# Patient Record
Sex: Female | Born: 1965 | Race: White | Hispanic: No | Smoking: Current every day smoker
Health system: Southern US, Community
[De-identification: ages and names within clinical notes are randomized; demographics above are authoritative.]

## PROBLEM LIST (undated history)

## (undated) DIAGNOSIS — F32A Depression, unspecified: Secondary | ICD-10-CM

## (undated) DIAGNOSIS — F329 Major depressive disorder, single episode, unspecified: Secondary | ICD-10-CM

## (undated) HISTORY — PX: CHOLECYSTECTOMY: SHX55

## (undated) HISTORY — DX: Depression, unspecified: F32.A

## (undated) HISTORY — PX: CORONARY ARTERY BYPASS GRAFT: SHX141

## (undated) HISTORY — DX: Major depressive disorder, single episode, unspecified: F32.9

---

## 1999-04-01 ENCOUNTER — Other Ambulatory Visit: Admission: RE | Admit: 1999-04-01 | Discharge: 1999-04-01 | Payer: Self-pay | Admitting: *Deleted

## 2000-05-11 ENCOUNTER — Other Ambulatory Visit: Admission: RE | Admit: 2000-05-11 | Discharge: 2000-05-11 | Payer: Self-pay | Admitting: *Deleted

## 2005-10-30 ENCOUNTER — Other Ambulatory Visit: Admission: RE | Admit: 2005-10-30 | Discharge: 2005-10-30 | Payer: Self-pay | Admitting: Obstetrics and Gynecology

## 2006-02-16 ENCOUNTER — Inpatient Hospital Stay (HOSPITAL_COMMUNITY): Admission: AD | Admit: 2006-02-16 | Discharge: 2006-02-16 | Payer: Self-pay | Admitting: Obstetrics & Gynecology

## 2006-04-20 ENCOUNTER — Inpatient Hospital Stay (HOSPITAL_COMMUNITY): Admission: AD | Admit: 2006-04-20 | Discharge: 2006-04-20 | Payer: Self-pay | Admitting: Obstetrics and Gynecology

## 2006-05-15 ENCOUNTER — Inpatient Hospital Stay (HOSPITAL_COMMUNITY): Admission: RE | Admit: 2006-05-15 | Discharge: 2006-05-19 | Payer: Self-pay | Admitting: Obstetrics and Gynecology

## 2006-07-08 ENCOUNTER — Ambulatory Visit (HOSPITAL_COMMUNITY): Admission: RE | Admit: 2006-07-08 | Discharge: 2006-07-08 | Payer: Self-pay | Admitting: Obstetrics and Gynecology

## 2006-10-28 ENCOUNTER — Ambulatory Visit (HOSPITAL_COMMUNITY): Admission: RE | Admit: 2006-10-28 | Discharge: 2006-10-28 | Payer: Self-pay | Admitting: Obstetrics and Gynecology

## 2008-03-02 IMAGING — RF DG HYSTEROGRAM
5 series · 9 of 9 positions shown · non-contrast
Comparison: none

CLINICAL DATA: 40 year-old status-post Essure.  HSG is performed by Dr. Labelle Salha.  Five images are submitted.
HYSTEROSALPINGOGRAM:

[Series 1: run · 2 of 2 slices shown (1 of 5)]
[im 1/2]
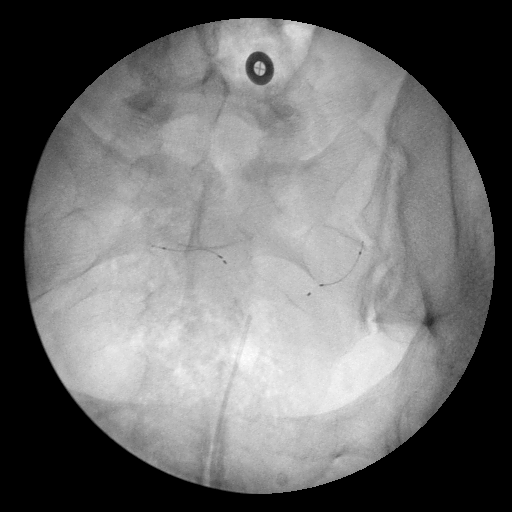
[im 2/2]
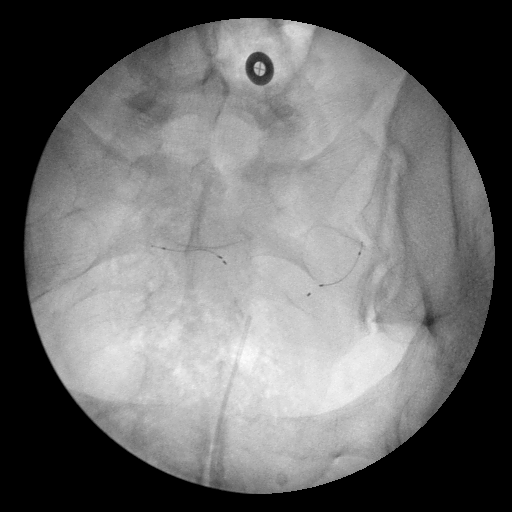

[Series 2: run · 1 of 1 slices shown (2 of 5)]
[im 1/1]
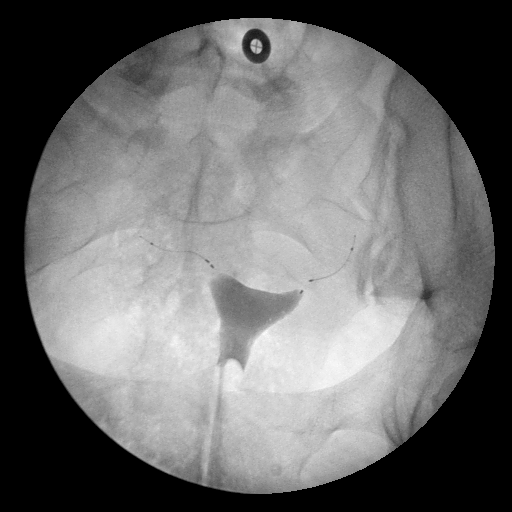

[Series 3: run · 2 of 2 slices shown (3 of 5)]
[im 1/2]
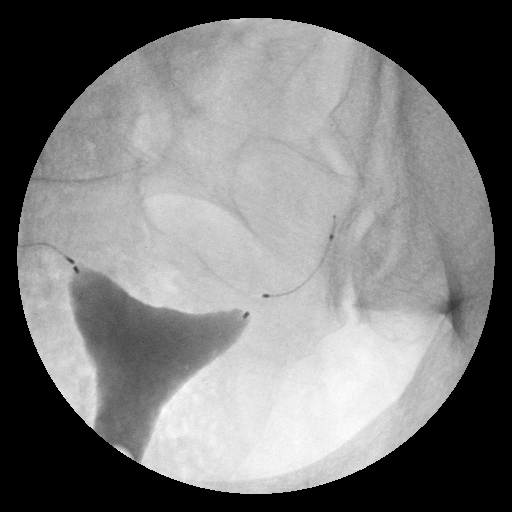
[im 2/2]
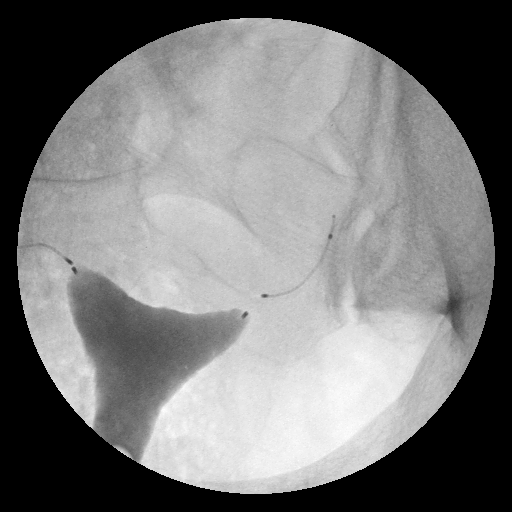

[Series 4: run · 2 of 2 slices shown (4 of 5)]
[im 1/2]
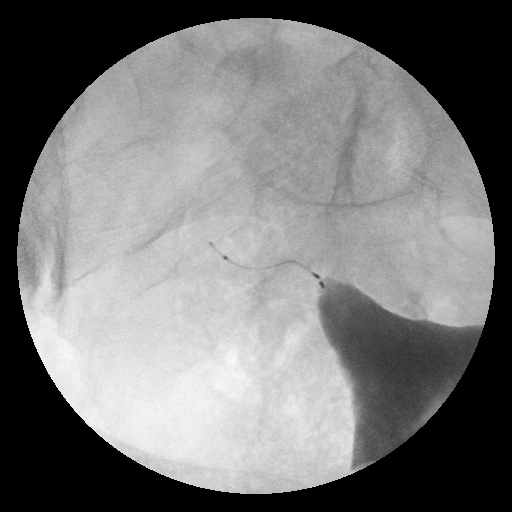
[im 2/2]
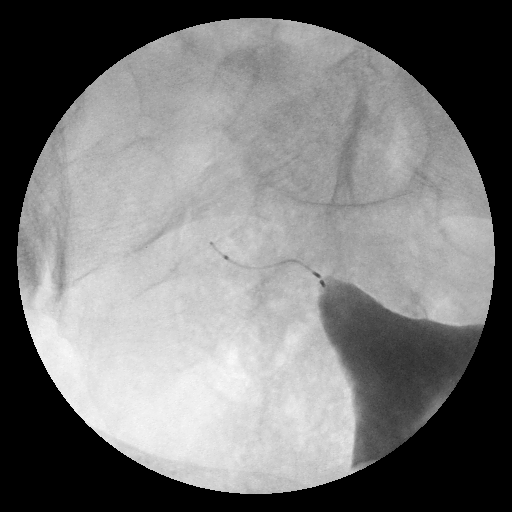

[Series 5: run · 2 of 2 slices shown (5 of 5)]
[im 1/2]
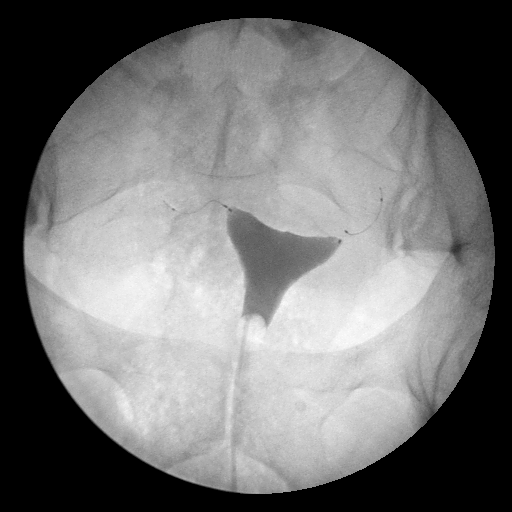
[im 2/2]
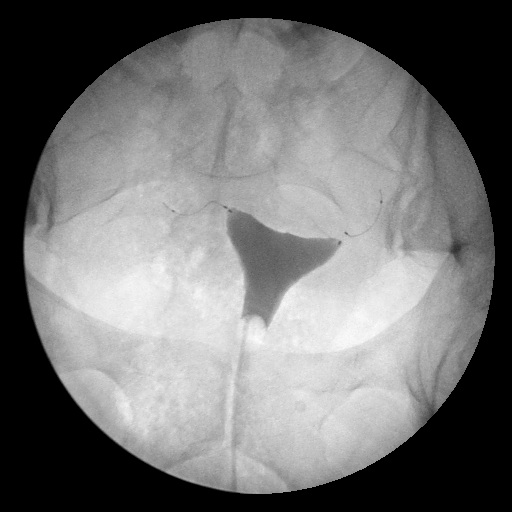

[9 of 9 positions shown; findings below may reference images not displayed]

FINDINGS: Bilateral Essure microinserts are in place.  The proximal ends are positioned at the cornual regions bilaterally.  No contrast is seen to reflux along the microinserts or distally.  The contour of the uterus is normal.
IMPRESSION: No evidence for tubal patency.

## 2009-01-28 ENCOUNTER — Emergency Department (HOSPITAL_COMMUNITY): Admission: EM | Admit: 2009-01-28 | Discharge: 2009-01-28 | Payer: Self-pay | Admitting: Emergency Medicine

## 2010-06-03 IMAGING — CT CT HEAD W/O CM
1 of 2 series · 13 of 30 positions shown, 17 images · non-contrast
Comparison: None available.

CLINICAL DATA: Seizures, fever.

CT HEAD WITHOUT CONTRAST
TECHNIQUE: Contiguous axial images were obtained from the base of
the skull through the vertex without contrast.

[Series 2: brain · axial · 0.47mm/px · z∈[+151,+276]mm · 13 of 28 slices shown, 17 images]
[im 2/28  brain]
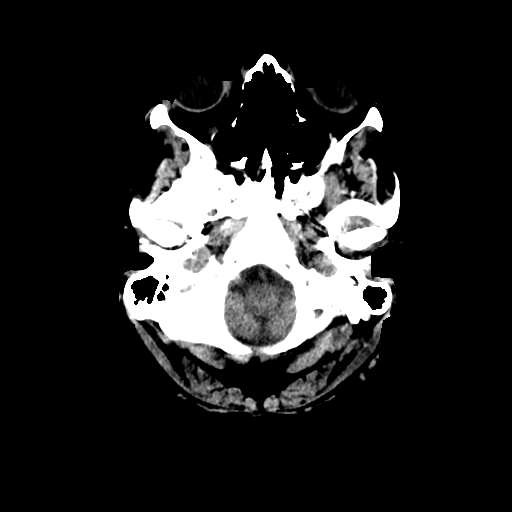
[im 2/28  bone]
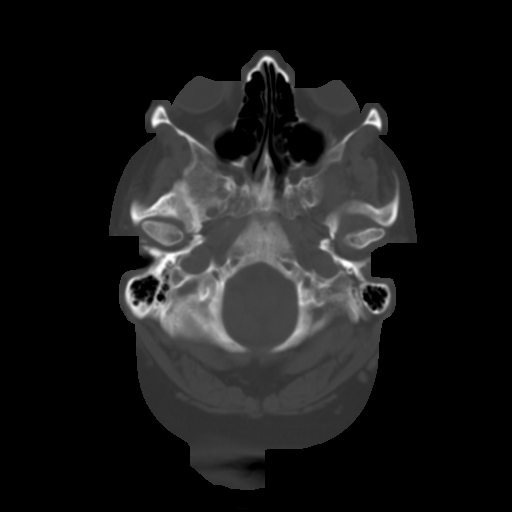
[im 4/28  brain]
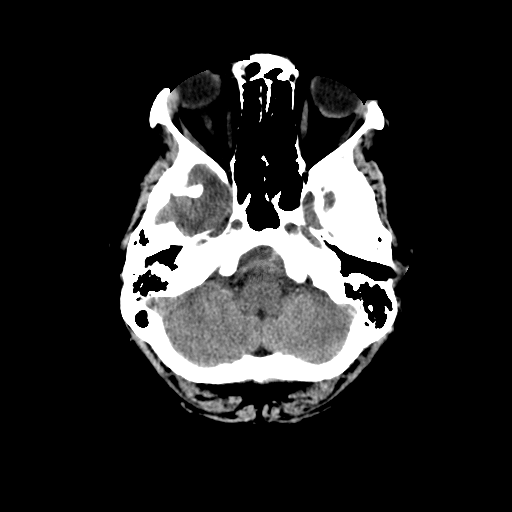
[im 6/28  brain]
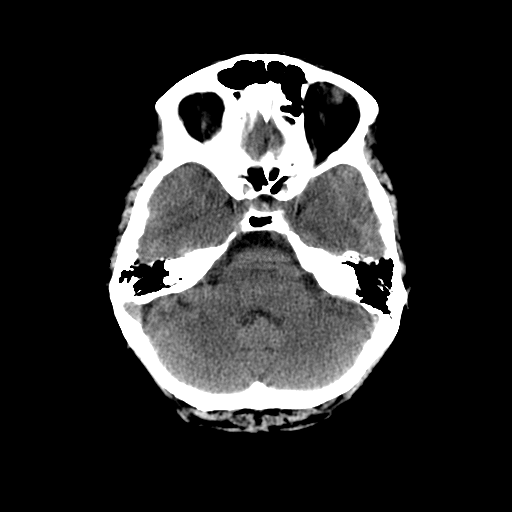
[im 8/28  brain]
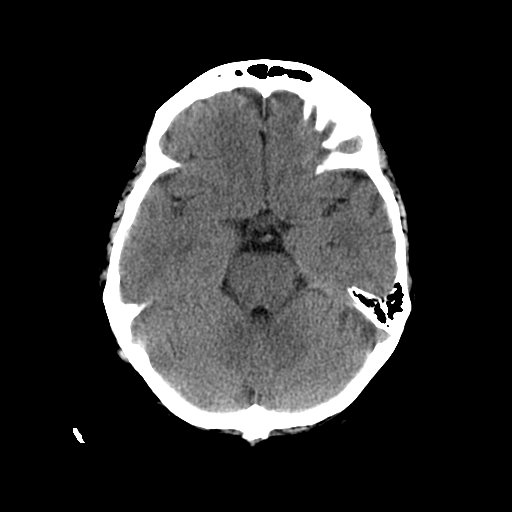
[im 10/28  brain]
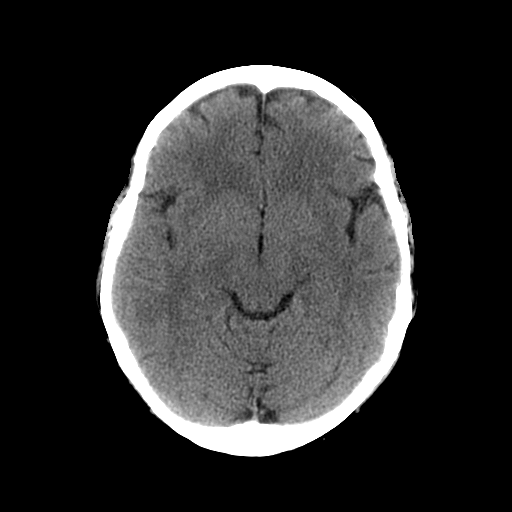
[im 10/28  bone]
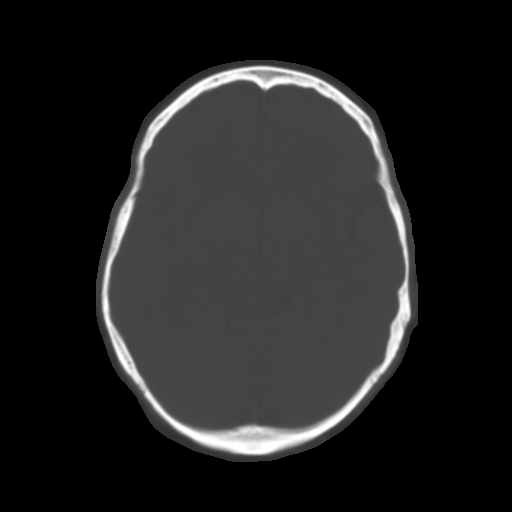
[im 12/28  brain]
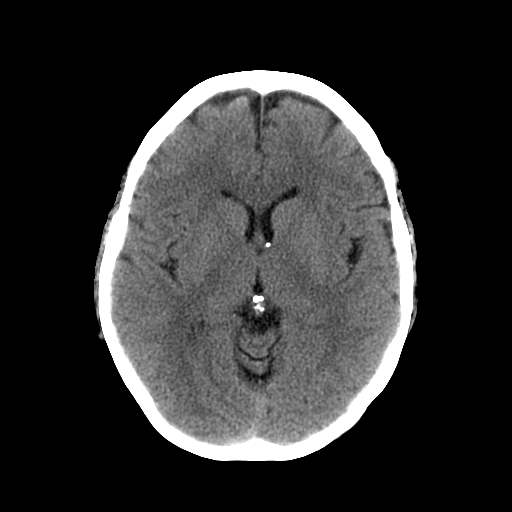
[im 14/28  brain]
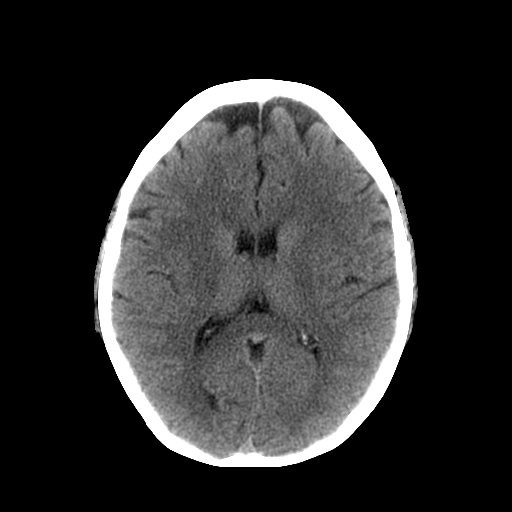
[im 16/28  brain]
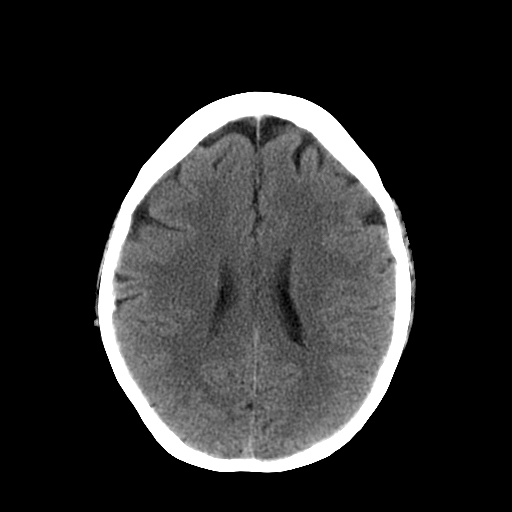
[im 18/28  brain]
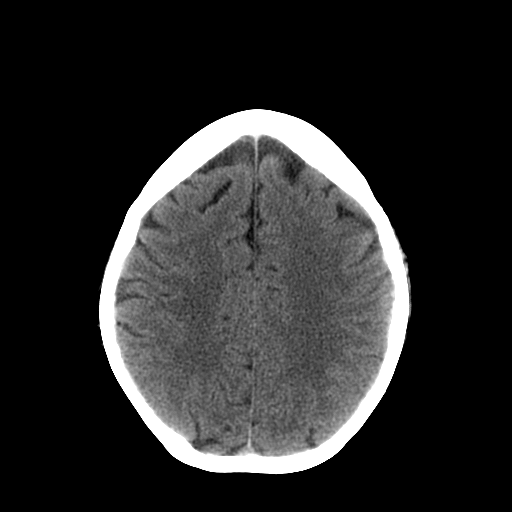
[im 18/28  bone]
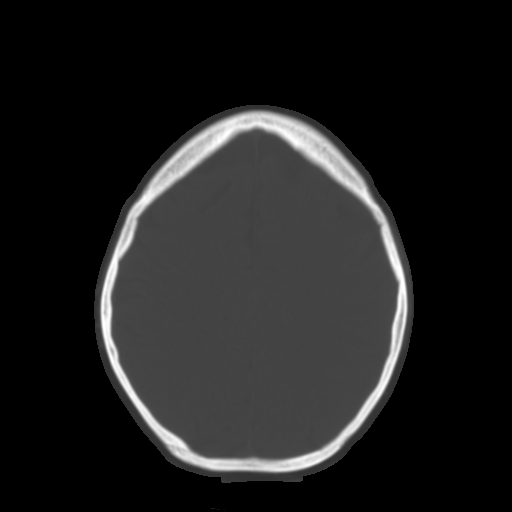
[im 20/28  brain]
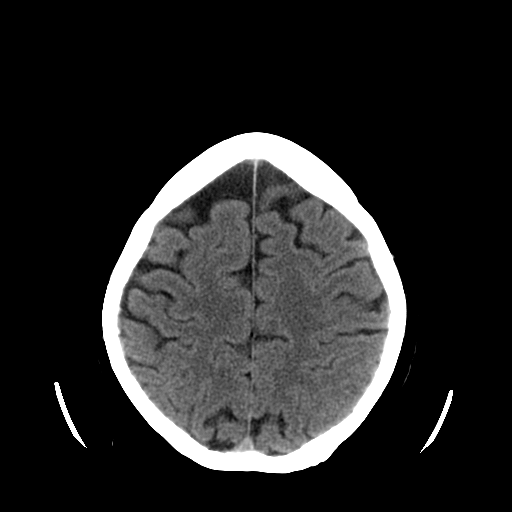
[im 22/28  brain]
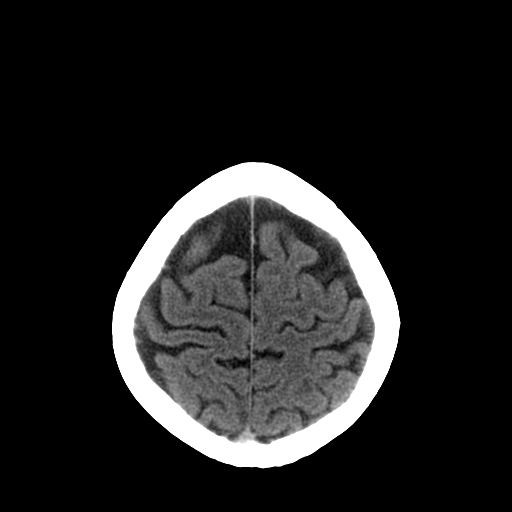
[im 24/28  brain]
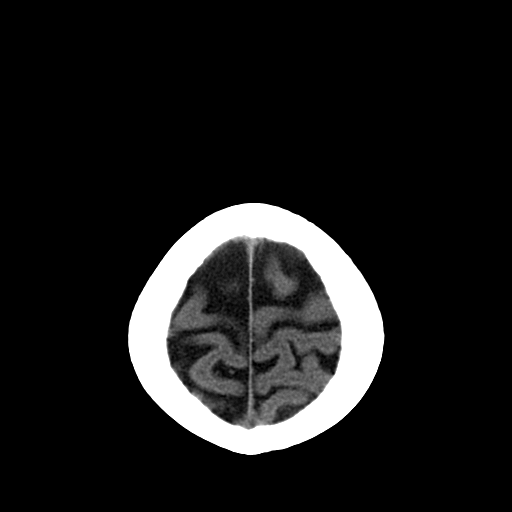
[im 26/28  brain]
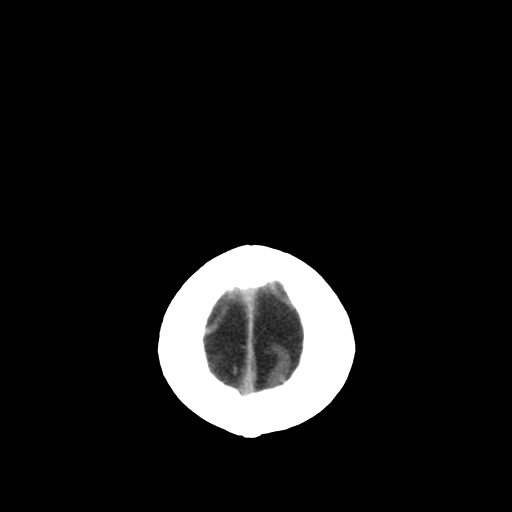
[im 26/28  bone]
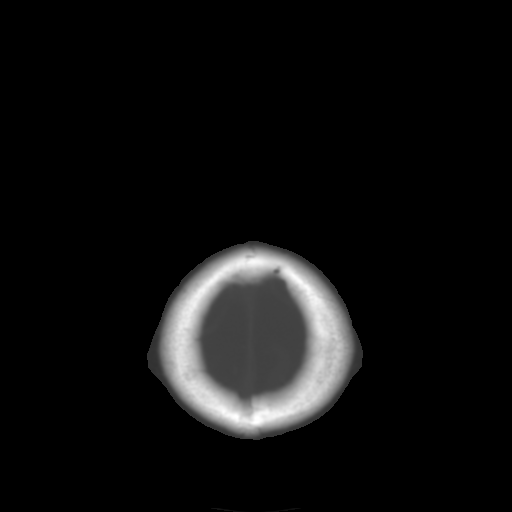

[13 of 30 positions shown; findings below may reference images not displayed]

FINDINGS: There is some cortical atrophy.  No evidence of acute
intracranial abnormality including acute infarction, hemorrhage,
mass lesion, mass effect, midline shift or abnormal extra-axial
fluid collection.  No hydrocephalus.  Imaged paranasal sinuses and
mastoid air cells clear.
IMPRESSION: No acute finding.

## 2011-01-15 LAB — COMPREHENSIVE METABOLIC PANEL
AST: 25 U/L (ref 0–37)
BUN: 5 mg/dL — ABNORMAL LOW (ref 6–23)
CO2: 24 mEq/L (ref 19–32)
GFR calc Af Amer: >60 mL/min (ref >60)
GFR calc non Af Amer: >60 mL/min (ref >60)
Glucose, Bld: 93 mg/dL (ref 70–99)
Potassium: 3.2 mEq/L — ABNORMAL LOW (ref 3.5–5.1)

## 2011-01-15 LAB — URINALYSIS, ROUTINE W REFLEX MICROSCOPIC
Bilirubin Urine: NEGATIVE
Glucose, UA: NEGATIVE mg/dL
Ketones, ur: NEGATIVE mg/dL
Ketones, ur: NEGATIVE mg/dL
Protein, ur: NEGATIVE mg/dL

## 2011-01-15 LAB — URINE MICROSCOPIC-ADD ON

## 2011-01-15 LAB — CBC
HCT: 43.1 % (ref 36.0–46.0)
Hemoglobin: 14.8 g/dL (ref 12.0–15.0)
Platelets: 274 10*3/uL (ref 150–400)
RBC: 4.84 MIL/uL (ref 3.87–5.11)
RDW: 13.2 % (ref 11.5–15.5)

## 2011-01-15 LAB — URINE CULTURE: Colony Count: >=100000

## 2011-01-15 LAB — WET PREP, GENITAL
Clue Cells Wet Prep HPF POC: NONE SEEN
Yeast Wet Prep HPF POC: NONE SEEN

## 2011-01-15 LAB — GC/CHLAMYDIA PROBE AMP, GENITAL: GC Probe Amp, Genital: NEGATIVE

## 2011-02-21 NOTE — Op Note (Signed)
Ann Wood, Ann Wood           ACCOUNT NO.:  [id number]   MEDICAL RECORD NO.:  [id number]          PATIENT TYPE:  INP   LOCATION:  9133                          FACILITY:  [facility]   PHYSICIAN:  Juluis Mire, M.D.   DATE OF BIRTH:  [birth year]   DATE OF PROCEDURE:  05/15/2006  DATE OF DISCHARGE:                                 OPERATIVE REPORT   PREOPERATIVE DIAGNOSIS:  Intrauterine pregnancy at term with microsomia.   POSTOPERATIVE DIAGNOSIS:  Intrauterine pregnancy at term with microsomia.   PROCEDURE:  Low transverse caesarean section.   SURGEON:  Juluis Mire, M.D.   ANESTHESIA:  Spinal.   ESTIMATED BLOOD LOSS:  500 to 600 cc.   PACKING AND DRAINS:  None.   BLOOD REPLACED:  None.   COMPLICATIONS:  None.   INDICATIONS:  Details in history and physical.   DESCRIPTION OF PROCEDURE:  The patient was taken to the OR and placed in the  supine position with left lateral tilt.  After satisfactory level of spinal  anesthesia was obtained, the abdomen was prepped with Betadine and draped  sterile field.  A low transverse skin incision was made with the knife and  carried through the subcutaneous tissue.  Fascia was entered sharply  incising the fascia laterally.  Fascia was taken off the muscle superiorly  and inferiorly.  Rectus muscles were separated in the midline.  Peritoneum  was entered sharply.  Incision in peritoneum extended both superiorly and  inferiorly.  A low transverse bladder flap was developed.  Low transverse  uterine incision was begun and extended laterally using manual traction. The  infant was delivered with elevation of head and fundal pressure.  It was a  viable female weighing 9 pounds 5 ounces, Apgars were 9/9.  Umbilical cord  pH 7.34.  Placenta was delivered manually.  Uterus was exteriorized for  closure.  Tubes and ovaries were unremarkable.  Uterus was closed with  running locking suture of 0 Vicryl using a two layer closure technique.  We  had  good hemostasis and clear urine output.  Uterus was returned to the  abdominal cavity.  Muscles and peritoneum were closed with running suture of  3-0 Vicryl, fascia closed with running suture of 0 PDS, skin was closed with  staples  and Steri-Strips.  Sponge and instrument counts reported correct by the  circulating nurse x2.  Foley catheter remained clear at time of  closure.  Patient tolerated the procedure well and returned to the recovery room in  good condition.      Juluis Mire, M.D.  Electronically Signed     JSM/MEDQ  D:  05/15/2006  T:  05/15/2006  Job:  [id number]

## 2011-02-21 NOTE — Op Note (Signed)
Ann Wood, Ann Wood           ACCOUNT NO.:  [id number]   MEDICAL RECORD NO.:  [id number]          PATIENT TYPE:  AMB   LOCATION:  SDC                           [facility]ACILITY:  [facility]   PHYSICIAN:  Juluis Mire, M.D.   DATE OF BIRTH:  07/24/1966   DATE OF PROCEDURE:  07/08/2006  DATE OF DISCHARGE:                                 OPERATIVE REPORT   PREOPERATIVE DIAGNOSIS:  Multiparity, desires sterility.   POSTOPERATIVE DIAGNOSIS:  Multiparity, desires sterility.   PROCEDURE:  Hysteroscopy with placement of the Essure bilaterally.   SURGEON:  Juluis Mire, M.D.   ANESTHESIA:  Paracervical block and MAC.   COMPLICATIONS:  None.   INTRAOPERATIVE BLOOD PLACEMENT:  None.   ESTIMATED BLOOD LOSS:  Minimal.   INDICATIONS FOR PROCEDURE:  Please see dictated history and physical.   DESCRIPTION OF PROCEDURE:  Patient taken to the OR and placed in the supine  position.  After sedation, was placed in the dorsal lithotomy position using  the Allen stirrups.  Patient then draped in a sterile field.  A speculum was  placed in the vaginal vault.  Cervix and vagina were cleansed with Betadine.  Paracervical block was instituted using 1% Nesacaine.  Cervix was secured  with single-tooth tenaculum.  Cervix already dilated and required no  dilatation.  The hysteroscope was introduced.  Intrauterine cavity was  distended using Sorbitol.  Both tubal ostia were visualized.  The Essure was  advanced into each tubal ostium up to the black mark.  The coil was then  brought backward until stop.  We then identified the notch in the coil.  We  then pressed the button and again rotated the wheel backward until full  stop.  We good uncoiling on both sides of the Essure.  After 10 seconds, we  rotated the handle counterclockwise until the delivery device was removed.  The procedure was followed bilaterally.  There were no signs of  complications.  We had approximately three coils visible at each tube.  There was no bleeding.  Total deficit was 300 mL.  At this point in time,  single-tooth tenaculum was inspected and removed.  Patient taken out of the  dorsal lithotomy position and once alert, transferred to the recovery room  in good condition.  Sponge, needle and instrument counts were reported as  correct by the circulating nurse.      Juluis Mire, M.D.  Electronically Signed    JSM/MEDQ  D:  07/08/2006  T:  07/09/2006  Job:  [id number]

## 2011-02-21 NOTE — H&P (Signed)
Ann Wood, Ann Wood           ACCOUNT NO.:  [id number]   MEDICAL RECORD NO.:  [id number]          PATIENT TYPE:  INP   LOCATION:  NA                            [facility]  [facility]   PHYSICIAN:  Juluis Mire, M.D.   DATE OF BIRTH:  Jan 21, 1966   DATE OF ADMISSION:  05/15/2006  DATE OF DISCHARGE:                                HISTORY & PHYSICAL   HISTORY OF PRESENT ILLNESS:  The patient is a 45 year old gravida 3, para 1,  abortus 1 female, last menstrual period August 31, 2005, giving an  estimated date of confinement of May 24, 2006.  This gives her an  estimated gestational age of [redacted] weeks, consistent with initial exam as well  as ultrasound.  Her prenatal course has been complicated by advanced  maternal age.  She underwent amniocentesis with findings of chromosomally  normal infant.  Subsequently, we have been watching her.  She has had  increasing fundal heights, larger than dates.  Subsequent ultrasound done on  May 08, 2006 revealed an estimated fetal weight of over 4000 g, at 9  pounds and 5 ounces.  We had discussed with her the potential risk of  macrosomia with shoulder dystocia.  After discussion of induction versus  primary cesarean section, the patient has decided to proceed with primary  cesarean section.  It is of note that on pelvic exam, the cervix was  extremely long and closed.  Vertex is out of the pelvis.   ALLERGIES:  NO KNOWN DRUG ALLERGIES.   MEDICATIONS:  Prenatal vitamins.   PAST MEDICAL HISTORY:  Please see prenatal records.   FAMILY HISTORY:  Please see prenatal records.   SOCIAL HISTORY:  Please see prenatal records.   REVIEW OF SYSTEMS:  Noncontributory.   PHYSICAL EXAMINATION:  VITAL SIGNS:  The patient is afebrile with stable  vital signs.  HEENT:  The patient is normocephalic.  Pupils equal, round, reactive to  light and accommodation.  Extraocular movements are intact.  Sclerae and  conjunctivae clear.  Oropharynx clear.  NECK:   Thyroid nonpalpable.  BREASTS:  Glandular, no discrete masses.  LUNGS:  Clear.  CARDIAC:  Regular rate with no murmurs or gallops.  ABDOMEN:  Gravid uterus consistent with dates.  PELVIC:  The cervix is extremely long and closed, with the presenting part  out of the pelvis.  EXTREMITIES:  Trace edema.  Deep tendon reflexes 2+, no clonus.  NEUROLOGIC:  Grossly within normal limits.   IMPRESSION:  1. Intrauterine pregnancy at 39 weeks with gross macrosomia.  2. Advanced maternal age with normal chromosome studies.   PLAN:  I have discussed the options.  Will proceed with primary cesarean  section.  The risks were discussed, including the risks of infection,  the  risk of hemorrhage which could require a transfusion, the risks of AIDS or  hepatitis, the risk of injury to adjacent organs, including bladder, bowel,  or ureters which could require further exploratory surgery; the risk of deep  venous thrombosis and pulmonary embolus.  The patient expressed  understanding of the indications and risks.      Ann Wood  Lisbeth Ply, M.D.  Electronically Signed     JSM/MEDQ  D:  05/15/2006  T:  05/15/2006  Job:  [id number]

## 2011-02-21 NOTE — H&P (Signed)
NAME:  Ann Wood, Ann Wood           ACCOUNT NO.:  [id number]   MEDICAL RECORD NO.:  [id number]          PATIENT TYPE:  AMB   LOCATION:  SDC                           [facility]   PHYSICIAN:  Juluis Mire, M.D.   DATE OF BIRTH:  [birth year]   DATE OF ADMISSION:  07/08/2006  DATE OF DISCHARGE:                                HISTORY & PHYSICAL   The patient is a 45 year old gravida 3, para 2, abortus 1, married female  who presents for permanent sterilization using the Essure process.  In  relation to the present admission, the patient recently had a delivery.  She  desires permanent sterilization.  The potential irreversibility of  sterilization was explained.  Failure rates are discussed.  Alternatives for  birth control have been explained.   ALLERGIES:  No known drug allergies.   MEDICATIONS:  Vitamins.   PAST MEDICAL HISTORY:  Usual childhood diseases.  No significant sequelae.   PAST SURGICAL HISTORY:  She has had a cholecystectomy at age 77.   OBSTETRICAL HISTORY:  She had a vaginal delivery in 1997, had a miscarriage  in 2004, and this year had a delivery by cesarean section.   FAMILY HISTORY:  Noncontributory.   SOCIAL HISTORY:  No tobacco or alcohol use.   REVIEW OF SYSTEMS:  Noncontributory.   PHYSICAL EXAMINATION:  GENERAL:  The patient is afebrile with stable vital signs.  HEENT:  The patient is normocephalic, pupils equal, round, and reactive to  light and accommodation, extraocular movements were intact.  Sclerae and  conjunctivae are clear.  Oropharynx clear.  NECK:  Without thyromegaly.  BREASTS:  No discrete masses.  LUNGS:  Clear.  CARDIOVASCULAR SYSTEM:  Regular rhythm and rate without murmurs or gallops.  ABDOMEN:  Her abdominal exam is benign, there are no masses, organomegaly or  tenderness noted.  PELVIC:  On pelvic exam, normal external genitalia.  Vaginal mucosa is  clear.  Cervix is unremarkable.  Uterus feels to be of normal size, shape  and contour.  Adnexa free of masses or tenderness.  Rectovaginal exam is  clear.  EXTREMITIES:  Trace edema.  NEUROLOGIC:  Exam is grossly within normal limits.   IMPRESSION:  Multiparity, desires sterility.   PLAN:  The patient will undergo hysteroscopic evaluation with placement of  the Ensure device.  The risks of the procedure have been explained including  the potential risk of infection.  The risk of hemorrhage through a  vasculature injury that could require transfusion with associated risks of  AIDS or hepatitis, hemorrhage could also require hysterectomy.  There is a  risk of perforating the uterus that could lead to injury to adjacent organs  included but not limited to bowel that could require exploratory surgery.  There is a risk of deep venous thrombosis and pulmonary embolus.  There is  also the potential, at the time of the hysterosalpingogram, tubes may not be  blocked or there may be displacement of the coil into the abdomen requiring  laparoscopic removal or outside of the uterus that could require  alternatives for birth control.  The patient expressed her understanding  of  indications and risks.      Juluis Mire, M.D.  Electronically Signed     JSM/MEDQ  D:  07/08/2006  T:  07/08/2006  Job:  [id number]

## 2011-02-21 NOTE — Discharge Summary (Signed)
Ann Wood, HEIDLER           ACCOUNT NO.:  [id number]   MEDICAL RECORD NO.:  [id number]          PATIENT TYPE:  INP   LOCATION:  9133                          [facility]ACILITY:  [facility]   PHYSICIAN:  Guy Sandifer. Henderson Cloud, M.D. DATE OF BIRTH:  1965-11-19   DATE OF ADMISSION:  05/15/2006  DATE OF DISCHARGE:  05/19/2006                                 DISCHARGE SUMMARY   ADMITTING DIAGNOSES:  1. Intrauterine pregnancy at term.  2. Fetal macrosomia.   DISCHARGE DIAGNOSES:  1. Status post low transverse cesarean section.  2. Viable female infant.   PROCEDURE:  Primary low transverse cesarean section.   REASON FOR ADMISSION:  Please see dictated H&P.   HOSPITAL COURSE:  Patient is a 45 year old gravida 3, para 1 that was  admitted to [facility] for a scheduled cesarean section.  The patient had fetus with estimated fetal weight of greater than 4000  grams.  She was admitted for cesarean delivery.  On the morning of  admission, the patient was taken to the operating room where spinal  anesthesia was administered without difficulty.  A low transverse incision  was made with delivery of a viable female infant weighing 9 pounds 5 ounces  with Apgars of 9 at one minute and 9 at five minutes.  Arterial cord pH was  7.34.  The patient tolerated procedure well and taken to the recovery room  in stable condition.  On postoperative day #1 patient was without complaint.  Vital signs were stable.  She was afebrile.  Abdomen soft with good return  of bowel function.  Fundus firm and nontender.  Abdominal dressing had been  removed revealing an incision that was clean, dry and intact.  Laboratory  findings revealed hemoglobin of 10.8, platelet count of 264,000, WBC count  of 12.8.  On postoperative day #2 patient did complain of some abdominal  distention.  She was without nausea.  Vital signs were stable.  Abdomen was  soft with good return of bowel function.  Fundus firm and nontender.  Incision was clean, dry and intact.  The patient was administered Dulcolax  suppository.  On the following morning, patient did complain of some trapped  gas; she did admit to some flatus and positive results with Dulcolax  suppository.  The vital signs remained stable.  She was afebrile.  Incision  was clean, dry and intact.  On the following morning, the patient was  somewhat tearful.  Baby had sustained some weight loss and was unable to be  discharged.  Patient's vital signs were stable.  She was afebrile.  Abdomen  was now soft with good return of bowel function.  Fundus firm and nontender.  Incision was clean, dry and intact.  Staples were left intact.  She was  ambulating well.  Discharge instructions were reviewed and the patient was  later discharged home   CONDITION ON DISCHARGE:  Stable.   DIET:  Regular as tolerated.   ACTIVITY:  No heavy lifting, no driving x2 weeks, no vaginal entry.   FOLLOWUP:  Patient to follow up in the office in 1-2 weeks for an incision  check.  She is to call for temperature greater than 100 degrees, persistent  nausea, vomiting, heavy vaginal bleeding and/or redness or drainage from the  incisional site.   DISCHARGE MEDICATIONS:  1. Percocet 5/325 #30 1 p.o. q.4-6h. p.r.n.  2. Motrin 600 mg q.6h.  3. Prenatal vitamins 1 p.o. daily.  4. Tandem. 1 p.o. daily.      Julio Sicks, N.P.      Guy Sandifer. Henderson Cloud, M.D.  Electronically Signed    CC/MEDQ  D:  06/12/2006  T:  06/12/2006  Job:  [id number]

## 2012-11-17 ENCOUNTER — Ambulatory Visit (INDEPENDENT_AMBULATORY_CARE_PROVIDER_SITE_OTHER): Payer: 59 | Admitting: Physician Assistant

## 2012-11-17 VITALS — BP 115/77 | HR 66 | Temp 98.0°F | Resp 14 | Ht 66.5 in | Wt 255.0 lb

## 2012-11-17 DIAGNOSIS — J029 Acute pharyngitis, unspecified: Secondary | ICD-10-CM

## 2012-11-17 DIAGNOSIS — J019 Acute sinusitis, unspecified: Secondary | ICD-10-CM

## 2012-11-17 DIAGNOSIS — R05 Cough: Secondary | ICD-10-CM

## 2012-11-17 LAB — POCT INFLUENZA A/B: Influenza A, POC: NEGATIVE

## 2012-11-17 MED ORDER — HYDROCODONE-HOMATROPINE 5-1.5 MG/5ML PO SYRP: ORAL_SOLUTION | ORAL | Status: DC

## 2012-11-17 MED ORDER — IPRATROPIUM BROMIDE 0.06 % NA SOLN: 2.0000 | Freq: Three times a day (TID) | NASAL | Status: DC

## 2012-11-17 MED ORDER — AMOXICILLIN-POT CLAVULANATE 875-125 MG PO TABS: 1.0000 | ORAL_TABLET | Freq: Two times a day (BID) | ORAL | Status: DC

## 2012-11-17 NOTE — Progress Notes (Signed)
Patient ID: Ann Wood MRN: [id number], DOB: 24-Apr-1966, 47 y.o. Date of Encounter: 11/17/2012, 12:39 PM  Primary Physician: No primary provider on file.  Chief Complaint:  Chief Complaint  Patient presents with  . Sinusitis    yesterday  . Sore Throat  . Headache  . Generalized Body Aches    HPI: 47 y.o. year old female presents with a two day history of nasal congestion, post nasal drip, sore throat, sinus pressure, and cough. Afebrile. No chills. Nasal congestion thick and green/yellow. Sinus pressure is the worst symptom along the frontal sinus. Cough is productive secondary to post nasal drip and not associated with time of day. No shortness of breath or wheezing. Sore throat has started to improve. Ears feel full, leading to sensation of muffled hearing. Has tried ibuprofen without success. No GI complaints. Appetite normal. Two kids are ill with similar symptoms. No recent antibiotics or recent travels. Currently smokes about half to less than half a pack per day. No leg trauma, sedentary periods, or h/o cancer. She is not interested taking medication to help with smoking cessation.   Past Medical History  Diagnosis Date  . Depression      Home Meds: Prior to Admission medications   Medication Sig Start Date End Date Taking? Authorizing Provider  FLUoxetine (PROZAC) 10 MG capsule Take 10 mg by mouth daily.   Yes Historical Provider, MD    Allergies: No Known Allergies  History   Social History  . Marital Status: Single    Spouse Name: N/A    Number of Children: N/A  . Years of Education: N/A   Occupational History  . Not on file.   Social History Main Topics  . Smoking status: Current Every Day Smoker -- 0.50 packs/day    Types: Cigarettes  . Smokeless tobacco: Not on file  . Alcohol Use: No  . Drug Use: No  . Sexually Active: Yes   Other Topics Concern  . Not on file   Social History Narrative  . No narrative on file     Review of  Systems: Constitutional: Positive for fatigue and myalgias. Negative for chills, fever, night sweats or weight changes HEENT: see above Cardiovascular: negative for chest pain or palpitations Respiratory: Positive for cough. Negative for hemoptysis, wheezing, dyspnea, or shortness of breath Abdominal: negative for abdominal pain, nausea, vomiting or diarrhea Dermatological: negative for rash Neurologic: Positive for headache. Negative for dizziness or vertigo   Physical Exam: Blood pressure 115/77, pulse 66, temperature 98 F (36.7 C), temperature source Oral, resp. rate 14, height 5' 6.5" (1.689 m), weight 255 lb (115.667 kg), last menstrual period 10/30/2012, SpO2 99.00%., Body mass index is 40.55 kg/(m^2). General: Well developed, well nourished, in no acute distress. Head: Normocephalic, atraumatic, eyes without discharge, sclera non-icteric, nares are congested. Bilateral auditory canals clear, TM's are without perforation, pearly grey with reflective cone of light bilaterally. Serous effusion bilaterally behind TM's. Frontal sinus TTP. Oral cavity moist, dentition normal. Posterior pharynx with post nasal drip and mild erythema. No peritonsillar abscess or tonsillar exudate. Uvula midline. Neck: Supple. No thyromegaly. Full ROM. No lymphadenopathy. Lungs: Clear bilaterally to auscultation without wheezes, rales, or rhonchi. Breathing is unlabored.  Heart: RRR with S1 S2. No murmurs, rubs, or gallops appreciated. Msk:  Strength and tone normal for age. Extremities: No clubbing or cyanosis. No edema. Neuro: Alert and oriented X 3. Moves all extremities spontaneously. CNII-XII grossly in tact. Psych:  Responds to questions appropriately with a normal  affect.   Labs: Results for orders placed in visit on 11/17/12  POCT INFLUENZA A/B      Result Value Range   Influenza A, POC Negative     Influenza B, POC Negative       ASSESSMENT AND PLAN:  47 y.o. female with sinusitis and tobacco  abuse -Augmentin 875/125 mg 1 po bid #20 no RF -Atrovent NS 0.06% 2 sprays each nare bid prn #1 no RF -Hycodan #4oz 1 tsp po q 4-6 hours prn cough no RF SED -Mucinex -Tylenol/Motrin prn -Rest/fluids -RTC precautions -RTC 3-5 days if no improvement  2) Tobacco abuse -Offered prescription options -She will proceed with electronic cigarette to taper -She can RTC anytime to discuss options again  Elinor Dodge, PA-C 11/17/2012 12:39 PM

## 2012-11-22 ENCOUNTER — Telehealth: Payer: Self-pay

## 2012-11-22 NOTE — Telephone Encounter (Signed)
PT STATES SHE HAD SEEN [facility] AND GIVEN 3 MEDICINES, WHEN SHE WENT TO THE PHARMACY WAS TOLD WITH THE COUGH MEDICINE WE HAVE TO CALL AND GET APPROVAL FROM HER INSURANCE. SHE HAVE [employer]. PLEASE CALL PT AT [phone]    [facility] FOR APPROVAL.  SHE USES [facility]

## 2012-11-22 NOTE — Telephone Encounter (Signed)
Prior Auth for Hycodan? I called her at number given, but they stated they could only get her paged if it is an emergency. I called her at home. Asked her to have pharmacy send me the prior authorization.

## 2014-07-10 ENCOUNTER — Other Ambulatory Visit: Payer: Self-pay | Admitting: Physician Assistant

## 2014-07-10 DIAGNOSIS — Z1239 Encounter for other screening for malignant neoplasm of breast: Secondary | ICD-10-CM

## 2014-07-27 ENCOUNTER — Other Ambulatory Visit: Payer: Self-pay | Admitting: Physician Assistant

## 2014-07-27 DIAGNOSIS — Z1231 Encounter for screening mammogram for malignant neoplasm of breast: Secondary | ICD-10-CM

## 2014-08-01 ENCOUNTER — Ambulatory Visit: Payer: Self-pay

## 2014-11-28 ENCOUNTER — Ambulatory Visit
Admission: RE | Admit: 2014-11-28 | Discharge: 2014-11-28 | Disposition: A | Discharge status: Home / Self Care | Payer: 59 | Source: Ambulatory Visit | Attending: Physician Assistant | Admitting: Physician Assistant

## 2014-11-28 DIAGNOSIS — Z1231 Encounter for screening mammogram for malignant neoplasm of breast: Secondary | ICD-10-CM

## 2014-11-29 ENCOUNTER — Other Ambulatory Visit: Payer: Self-pay | Admitting: Physician Assistant

## 2014-11-29 DIAGNOSIS — R928 Other abnormal and inconclusive findings on diagnostic imaging of breast: Secondary | ICD-10-CM

## 2014-12-06 ENCOUNTER — Ambulatory Visit
Admission: RE | Admit: 2014-12-06 | Discharge: 2014-12-06 | Disposition: A | Discharge status: Home / Self Care | Payer: 59 | Source: Ambulatory Visit | Attending: Physician Assistant | Admitting: Physician Assistant

## 2014-12-06 DIAGNOSIS — R928 Other abnormal and inconclusive findings on diagnostic imaging of breast: Secondary | ICD-10-CM

## 2017-10-09 DIAGNOSIS — N959 Unspecified menopausal and perimenopausal disorder: Secondary | ICD-10-CM | POA: Diagnosis not present

## 2017-10-09 DIAGNOSIS — R109 Unspecified abdominal pain: Secondary | ICD-10-CM | POA: Diagnosis not present

## 2017-10-10 DIAGNOSIS — Z0001 Encounter for general adult medical examination with abnormal findings: Secondary | ICD-10-CM | POA: Diagnosis not present

## 2017-11-02 DIAGNOSIS — Z0001 Encounter for general adult medical examination with abnormal findings: Secondary | ICD-10-CM | POA: Diagnosis not present

## 2017-11-02 DIAGNOSIS — B372 Candidiasis of skin and nail: Secondary | ICD-10-CM | POA: Diagnosis not present

## 2017-11-02 DIAGNOSIS — N959 Unspecified menopausal and perimenopausal disorder: Secondary | ICD-10-CM | POA: Diagnosis not present

## 2018-01-24 ENCOUNTER — Encounter (HOSPITAL_COMMUNITY): Payer: Self-pay | Admitting: Emergency Medicine

## 2018-01-24 ENCOUNTER — Other Ambulatory Visit: Payer: Self-pay

## 2018-01-24 ENCOUNTER — Ambulatory Visit (HOSPITAL_COMMUNITY)
Admission: EM | Admit: 2018-01-24 | Discharge: 2018-01-24 | Disposition: A | Discharge status: Home / Self Care | Payer: 59 | Attending: Internal Medicine | Admitting: Internal Medicine

## 2018-01-24 DIAGNOSIS — L03116 Cellulitis of left lower limb: Secondary | ICD-10-CM

## 2018-01-24 MED ORDER — ONDANSETRON HCL 4 MG PO TABS: 4.0000 mg | ORAL_TABLET | Freq: Three times a day (TID) | ORAL | 0 refills | Status: AC | PRN

## 2018-01-24 MED ORDER — CEPHALEXIN 500 MG PO CAPS: 500.0000 mg | ORAL_CAPSULE | Freq: Four times a day (QID) | ORAL | 0 refills | Status: AC

## 2018-01-24 MED ORDER — SULFAMETHOXAZOLE-TRIMETHOPRIM 800-160 MG PO TABS: 1.0000 | ORAL_TABLET | Freq: Two times a day (BID) | ORAL | 0 refills | Status: AC

## 2018-01-24 MED ORDER — CEFTRIAXONE SODIUM 1 G IJ SOLR
1.0000 g | Freq: Once | INTRAMUSCULAR | Status: AC
  Administered 2018-01-24: 1 g via INTRAMUSCULAR

## 2018-01-24 MED ORDER — CEFTRIAXONE SODIUM 1 G IJ SOLR
INTRAMUSCULAR | Status: AC
Start: 2018-01-24 — End: ?
  Filled 2018-01-24: qty 10

## 2018-01-24 MED ORDER — LIDOCAINE HCL (PF) 1 % IJ SOLN
INTRAMUSCULAR | Status: AC
  Filled 2018-01-24: qty 2

## 2018-01-24 NOTE — Discharge Instructions (Signed)
Warm compresses to the area.  Complete course of antibiotics. Tylenol and/or ibuprofen as needed for pain or fevers.  Activity as tolerated. Please make an appointment to follow up with your primary care provider in the next two days for recheck. If worsening, increased pain, fevers, weakness, confusion, vomiting or otherwise worsening please go to the Er.

## 2018-01-24 NOTE — ED Triage Notes (Signed)
States has possible "spider bite" on left calf yesterday

## 2018-01-24 NOTE — ED Provider Notes (Signed)
[facility]    CSN: [id number] Arrival date & time: 01/24/18  1454     History   Chief Complaint Chief Complaint  Patient presents with  . Insect Bite    HPI Ann Wood is a 52 y.o. female.   Ann Wood presents with complaints of pain and redness to left posterior calf which she noticed yesterday, she feels it is a bug bite. Vomited this morning. Took ibuprofen last night which minimally helped. Feels she has had a fever, did not check. Denies any previous similar. She is not diabetic. Otherwise without contributing medical histoyr.   ROS per HPI.      Past Medical History:  Diagnosis Date  . Depression     There are no active problems to display for this patient.   Past Surgical History:  Procedure Laterality Date  . CESAREAN SECTION    . CHOLECYSTECTOMY    . CORONARY ARTERY BYPASS GRAFT     per pt. she has "never" had this surgery 01/24/2018    OB History   None      Home Medications    Prior to Admission medications   Medication Sig Start Date End Date Taking? Authorizing Provider  cephALEXin (KEFLEX) 500 MG capsule Take 1 capsule (500 mg total) by mouth 4 (four) times daily for 10 days. 01/24/18 02/03/18  Georgetta HaberBurky, Nazaria Riesen B, NP  FLUoxetine (PROZAC) 10 MG capsule Take 10 mg by mouth daily.    [provider]  ondansetron (ZOFRAN) 4 MG tablet Take 1 tablet (4 mg total) by mouth every 8 (eight) hours as needed for nausea or vomiting. 01/24/18   Georgetta HaberBurky, Keiran Gaffey B, NP  sulfamethoxazole-trimethoprim (BACTRIM DS) 800-160 MG tablet Take 1 tablet by mouth 2 (two) times daily for 10 days. 01/24/18 02/03/18  Georgetta HaberBurky, Arshiya Jakes B, NP    Family History History reviewed. No pertinent family history.  Social History Social History   Tobacco Use  . Smoking status: Current Every Day Smoker    Packs/day: 0.50    Types: Cigarettes  . Smokeless tobacco: Never Used  Substance Use Topics  . Alcohol use: No  . Drug use: No     Allergies   Patient  has no known allergies.   Review of Systems Review of Systems   Physical Exam Triage Vital Signs ED Triage Vitals  Enc Vitals Group     BP 01/24/18 1515 136/72     Pulse Rate 01/24/18 1515 89     Resp --      Temp 01/24/18 1515 99 F (37.2 C)     Temp Source 01/24/18 1515 Oral     SpO2 01/24/18 1515 100 %     Weight --      Height --      Head Circumference --      Peak Flow --      Pain Score 01/24/18 1512 8     Pain Loc --      Pain Edu? --      Excl. in GC? --    No data found.  Updated Vital Signs BP 136/72 (BP Location: Left Arm)   Pulse 89   Temp 99 F (37.2 C) (Oral)   LMP 01/10/2018 Comment: using [employer]  SpO2 100%   Visual Acuity Right Eye Distance:   Left Eye Distance:   Bilateral Distance:    Right Eye Near:   Left Eye Near:    Bilateral Near:     Physical Exam  Constitutional: She is  oriented to person, place, and time. She appears well-developed and well-nourished. No distress.  Cardiovascular: Normal rate, regular rhythm and normal heart sounds.  Pulmonary/Chest: Effort normal and breath sounds normal.  Neurological: She is alert and oriented to person, place, and time.  Skin: Skin is warm and dry.     Red flat circular tender area approximately 1.5 cm in diameter with surrounding tissue redness, tight; without fluctuance or abscess presence palpable or visualized; see photo; strong pedal pulse sensation intact; mild redness which extends up leg- marked with pen        UC Treatments / Results  Labs (all labs ordered are listed, but only abnormal results are displayed) Labs Reviewed - No data to display  EKG None Radiology No results found.  Procedures Procedures (including critical care time)  Medications Ordered in UC Medications  cefTRIAXone (ROCEPHIN) injection 1 g (has no administration in time range)     Initial Impression / Assessment and Plan / UC Course  I have reviewed the triage vital signs and the nursing  notes.  Pertinent labs & imaging results that were available during my care of the patient were reviewed by me and considered in my medical decision making (see chart for details).     Sudden onset yesterday as far as patient knows. Afebrile without tachycardia here in clinic. Substantial cellulitis present on exam. She is not diabetic. Rocephin 1gm provided in clinic and started on keflex and bactrim at this time. Discussed return precautions at length with patient. She appears reliable and understanding of potential risks and agreeable to plan and to go to ER with any worsening of symptoms. Encouraged recheck in 2-3 days with PCP. Patient verbalized understanding and agreeable to plan.    Final Clinical Impressions(s) / UC Diagnoses   Final diagnoses:  Cellulitis of left lower extremity    ED Discharge Orders        Ordered    cephALEXin (KEFLEX) 500 MG capsule  4 times daily     01/24/18 1620    sulfamethoxazole-trimethoprim (BACTRIM DS) 800-160 MG tablet  2 times daily     01/24/18 1620    ondansetron (ZOFRAN) 4 MG tablet  Every 8 hours PRN     01/24/18 1620       Controlled Substance Prescriptions [place] Controlled Substance Registry consulted? Not Applicable   Georgetta Haber, NP 01/24/18 1637

## 2018-01-26 DIAGNOSIS — L0390 Cellulitis, unspecified: Secondary | ICD-10-CM | POA: Diagnosis not present

## 2023-07-02 ENCOUNTER — Encounter (HOSPITAL_COMMUNITY): Payer: Self-pay | Admitting: Emergency Medicine
